# Patient Record
Sex: Male | Born: 1975 | Race: Black or African American | Hispanic: No | Marital: Single | Smoking: Never smoker
Health system: Southern US, Community
[De-identification: ages and names within clinical notes are randomized; demographics above are authoritative.]

## PROBLEM LIST (undated history)

## (undated) DIAGNOSIS — A599 Trichomoniasis, unspecified: Secondary | ICD-10-CM

## (undated) HISTORY — PX: WRIST SURGERY: SHX841

---

## 1998-09-25 ENCOUNTER — Inpatient Hospital Stay (HOSPITAL_COMMUNITY): Admission: EM | Admit: 1998-09-25 | Discharge: 1998-09-25 | Payer: Self-pay | Admitting: Emergency Medicine

## 1998-09-25 ENCOUNTER — Encounter: Payer: Self-pay | Admitting: Emergency Medicine

## 2004-10-11 ENCOUNTER — Emergency Department (HOSPITAL_COMMUNITY): Admission: EM | Admit: 2004-10-11 | Discharge: 2004-10-11 | Payer: Self-pay | Admitting: Emergency Medicine

## 2004-10-18 ENCOUNTER — Ambulatory Visit (HOSPITAL_COMMUNITY): Admission: RE | Admit: 2004-10-18 | Discharge: 2004-10-18 | Payer: Self-pay | Admitting: Orthopaedic Surgery

## 2005-03-23 ENCOUNTER — Emergency Department (HOSPITAL_COMMUNITY): Admission: EM | Admit: 2005-03-23 | Discharge: 2005-03-23 | Payer: Self-pay | Admitting: Emergency Medicine

## 2006-01-02 ENCOUNTER — Emergency Department (HOSPITAL_COMMUNITY): Admission: EM | Admit: 2006-01-02 | Discharge: 2006-01-02 | Payer: Self-pay | Admitting: *Deleted

## 2006-01-30 ENCOUNTER — Emergency Department (HOSPITAL_COMMUNITY): Admission: EM | Admit: 2006-01-30 | Discharge: 2006-01-30 | Payer: Self-pay | Admitting: Emergency Medicine

## 2006-03-11 ENCOUNTER — Observation Stay (HOSPITAL_COMMUNITY): Admission: EM | Admit: 2006-03-11 | Discharge: 2006-03-12 | Payer: Self-pay | Admitting: Emergency Medicine

## 2007-10-20 ENCOUNTER — Emergency Department (HOSPITAL_COMMUNITY): Admission: EM | Admit: 2007-10-20 | Discharge: 2007-10-20 | Payer: Self-pay | Admitting: Family Medicine

## 2008-08-14 ENCOUNTER — Emergency Department (HOSPITAL_COMMUNITY): Admission: EM | Admit: 2008-08-14 | Discharge: 2008-08-14 | Payer: Self-pay | Admitting: *Deleted

## 2008-08-14 ENCOUNTER — Emergency Department (HOSPITAL_COMMUNITY): Admission: EM | Admit: 2008-08-14 | Discharge: 2008-08-14 | Payer: Self-pay | Admitting: Emergency Medicine

## 2008-08-26 ENCOUNTER — Emergency Department (HOSPITAL_COMMUNITY): Admission: EM | Admit: 2008-08-26 | Discharge: 2008-08-26 | Payer: Self-pay | Admitting: Emergency Medicine

## 2010-06-12 LAB — GC/CHLAMYDIA PROBE AMP, GENITAL: GC Probe Amp, Genital: NEGATIVE

## 2010-07-21 NOTE — Op Note (Signed)
NAME:  [name], [name]                ACCOUNT NO.:  [id number]   MEDICAL RECORD NO.:  [id number]          PATIENT TYPE:  OBV   LOCATION:  2550                         FACILITY:  [facility]   PHYSICIAN:  Tennis Must Meyerdierks, M.D.DATE OF BIRTH:  [birth year]   DATE OF PROCEDURE:  03/11/2006  DATE OF DISCHARGE:                               OPERATIVE REPORT   PREOPERATIVE DIAGNOSIS:  Transscaphoid perilunate dislocation, right  wrist.   POSTOPERATIVE DIAGNOSIS:  Transscaphoid perilunate dislocation, right  wrist.   PROCEDURE:  Open reduction internal fixation, right scaphoid with distal  radius bone grafting and repair of ligaments, right wrist.   SURGEON:  Lowell Bouton, M.D.   ANESTHESIA:  General.   OPERATIVE FINDINGS:  The patient had a fracture through the scaphoid at  the very proximal pole.  The scapholunate ligaments were intact and  after stabilizing the scaphoid, the lunate was reduced and stable.   PROCEDURE:  Under general anesthesia with a tourniquet on the right arm,  the right hand was prepped and draped in usual fashion and after  exsanguinating the limb, the tourniquet was inflated to 250 mmHg.  A  dorsal approach was made to the wrist longitudinally at the fourth  dorsal compartment.  It was carried down through the subcutaneous  tissues and bleeding points were coagulated.  The rent in the capsule  was easily identified and significant blood was released.  The capsule  was opened distal to the radiocarpal joint.  There was a defect where  the lunate was volarly dislocated.  With longitudinal traction and a  Therapist, nutritional, the lunate was reduced.  The scaphoid fragment was quite  small and an attempt was made to put a Accutrac screw using the mini  Accutrac.  This broke through the fragment as it was too small for the  end of the Accutrac screw.  At this point K-wires were used to stabilize  the fracture fragment and the K-wires were placed from proximal  to  distal.  They were brought out distally out percutaneously in the thenar  eminence.  They were brought right under the articular surface of the  fracture fragment.  This allowed the lunate to be stabilized by  stabilizing the scapholunate ligament.  There was a gap in the scaphoid  and it was felt that distal radius bone graft would be of benefit, so a  oval-shaped defect with a window was made in the dorsal cortex of the  distal radius.  This was done with a 45 K-wire and an osteotome was used  to remove the dorsal cortex.  Cancellous bone was removed with a curette  then placed in the scaphoid.  This should give a better chance to heal  as it is a proximal pole fracture and has a high incidence of AVN.  The  wound was then irrigated copiously.  The ligaments were repaired with a  4-0 Mersilene suture.  Capsule was closed with 4-0 Mersilene.  Subcutaneous tissue was closed over a vessel loop drain with 4-0 Vicryl.  Skin was closed with a 3-0  subcuticular Prolene.  Sterile dressings were applied followed by a  volar and dorsal wrist splints.  The patient had the tourniquet released  with good circulation of the hand.  He went to the recovery room awake  and stable in condition.      Lowell Bouton, M.D.  Electronically Signed     EMM/MEDQ  D:  03/11/2006  T:  03/11/2006  Job:  [id number]

## 2010-07-21 NOTE — Consult Note (Signed)
NAMERANDEE, UPCHURCH                ACCOUNT NO.:  [id number]   MEDICAL RECORD NO.:  [id number]          PATIENT TYPE:  EMS   LOCATION:  ED                           FACILITY:  [facility]   PHYSICIAN:  Vanita Panda. Magnus Ivan, M.D.DATE OF BIRTH:  Mar 14, 1975   DATE OF CONSULTATION:  10/11/2004  DATE OF DISCHARGE:                                   CONSULTATION   REASON FOR CONSULTATION:  Left wrist laceration with questionable nerve and  tendon injury.   HISTORY OF PRESENT ILLNESS:  Briefly, Winner is a 35 year old right-hand-  dominant male who fell off a porch coming out of a night club at  approximately 3 a.m. this morning. He later came to the emergency room for  further evaluation and treatment. Orthopedic/hand was consulted and I saw  the patient promptly secondary to a questionable nerve and tendon injury.  The patient reports a decreased sensation in his left index finger only and  difficulty with flexing the index finger. He denies any other injuries and  denies numbness and tingling in other aspects of his hand.   PAST MEDICAL HISTORY:  Negative.   ALLERGIES:  No known drug allergies.   MEDICATIONS:  Negative.   SOCIAL HISTORY:  He does report smoking about a pack of cigarettes a day. He  does not work at the moment. He does not use drugs but does drink alcohol  socially. He is, again, right-hand dominant.   REVIEW OF SYSTEMS:  Negative for chest pain, shortness of breath, fever,  chills, nausea, vomiting, GI, GU, or other complaints.   PHYSICAL EXAMINATION:  VITAL SIGNS:  He is afebrile with stable vital signs.  GENERAL:  This is an alert and oriented male in no acute distress. He  follows commands appropriately.  EXTREMITIES:  Examination of the left upper extremity shows about a 5 cm  laceration on the volar aspect of the wrist just ulnar to the midline. It is  at the wrist crease. There is particulate debris in the wound itself. As far  as physical exam goes, he has  entirely intact intrinsic function of his  fingers and his thumb shows full EPO/FPO. The middle, ring, and little  fingers show intact FDP, FDS, and EDC function. The index finger shows  intact FDS function as well as extensor function, but he has no FDP function  to the DIP joint. He also has subjective decreased sensation along the index  finger but normal sensation in the long finger on both aspects, the ring  finger, the little finger, and the thumb. He has normal wrist motion, normal  pronation-supination at the wrist, as well as ulnar and radial deviation at  the wrist.   X-rays show no bony injury.   IMPRESSION:  This is a 35 year old with a left wrist laceration with a  questionable partial median nerve injury and partial tendon injury.   PLAN:  The wound will be explored in the emergency room with cleaning of the  superficial tissues. Due to the nature of this wound this does warrant  surgical exploration with examination with the microscope, cleaning  of the  particulate matter, as well as possible nerve and tendon repair. After the  wound was cleaned and repaired by me, I placed him in dorsal blocking splint  with the wrist in a slight flexed position. He follow-up will be in 2 days  so I can repeat his examination, given the interesting nature of his exam  with only the FDP and slight decreased sensation of his index finger only.  After that exam we will likely set him up for surgery in the next 3-5 days  for nerve exploration repair, as well as likely tendon repairs. He  understands the risks and benefits of this. I am going to send him out on  oral antibiotics as well as pain medication and, again, have him follow up  this coming Friday in 2 days.     _    CYB/MEDQ  D:  10/11/2004  T:  10/11/2004  Job:  [id number]

## 2010-07-21 NOTE — Op Note (Signed)
[name], [name]                ACCOUNT NO.:  [id number]   MEDICAL RECORD NO.:  [id number]          PATIENT TYPE:  AMB   LOCATION:  SDS                          FACILITY:  [facility]   PHYSICIAN:  [name]. [name], M.D.DATE OF BIRTH:  [birth year]   DATE OF PROCEDURE:  10/18/2004  DATE OF DISCHARGE:  10/18/2004                                 OPERATIVE REPORT   PREOPERATIVE DIAGNOSIS:  Left wrist laceration with questionable tendinous  injury to left index finger and questionable median nerve injury.   POSTOPERATIVE DIAGNOSIS:  Left wrist flexor digitorum superficialis (FDS)  lacerations to the index, middle and ring fingers.   PROCEDURE:  1.  Left wrist wound exploration.  2.  Direct primary repair of left wrist FDS tendons to the index, long and      ring fingers.   SURGEON:  [name]. [name], M.D.   ANESTHESIA:  General.   COMPLICATIONS:  None.   ANTIBIOTICS:  Kefzol 1 g IV.   INDICATIONS:  Briefly, [name] is a 35 year old gentleman who injured his  nondominant left wrist when he was coming outside of a nightclub less than a  week ago.  He fell off of a porch and sustained an injury with some type of  sharp object to his left wrist.  He had a longitudinal incision on the right  just ulnar to the palmaris longus tendon.  The injury was a longitudinal  laceration measuring only approximately 3 cm in length.  He underwent  irrigation, debridement in the emergency room and closure of the wound.  His  exam at the time showed some numbness along the index finger as well as an  inability to flex the DIP joint of the next finger.  He also had weak  flexion of the index, long and ring fingers at the PIP joints, but their  motion was intact.  This warranted exploration of the wound in an operative  environment.  He now returns to the operating room for wound exploration  with possible nerve and tendon repair.  The risks and benefits of this have  been explained to him  and well understood.   PROCEDURE DESCRIPTION:  After informed consent was obtained and the left arm  was marked, [name] was brought to the operating room and placed supine on  the operating table.  General anesthesia was obtained and then his left arm  was prepped from the fingers through the forearm and the elbow with Betadine  scrub and paint and then sterile tourniquet was placed around his upper left  arm.  An Esmarch was used to wrap up the arm and the tourniquet was inflated  to 250 mm of pressure.  The wound was opened and extended proximally and  distally to allow for exploration of the wound.  There was noted right away  to be lacerations of FDS to the index, long and ring fingers.  Surprisingly,  the FDP tendon was found to be intact and the median nerve was found to be  completely intact as well.  After cleaning the wound, he underwent direct  primary repair of FDS to the index, long and ring fingers sequentially using  3-0 Ethibond suture.  These were all repaired with the modified Kessler  technique to allow for core sutures across each suture strand.  I then used  a 6-0 epitendinous Prolene suture to run the epitendinous portion of the  suture.  I then put the fingers through a range of motion and found that the  repair was completely intact and there was no other injuries noted.  I then  cleaned the wound again and closed the superficial tissues with interrupted  2-0 Vicryl and the skin with interrupted 3-0 Prolene suture.  A well padded  sterile dressing was applied and I placed his wrist in a slightly palmar  flexed position with a plaster splint.  The tourniquet was then let down at  55 minutes of total tourniquet time and the fingers did pinken nicely.  The  patient was awakened, extubated and taken to the recovery room in stable  condition.  He will return in 2 days to the clinic for initiating a  splinting protocol for flexor tendon repair.            ______________________________  [name]. [name], M.D.     CYB/MEDQ  D:  10/30/2004  T:  10/31/2004  Job:  [id number]

## 2010-07-21 NOTE — Op Note (Signed)
NAMEFREMON, [name]                ACCOUNT NO.:  [id number]   MEDICAL RECORD NO.:  [id number]          PATIENT TYPE:  AMB   LOCATION:  SDS                          FACILITY:  [facility]   PHYSICIAN:  Vanita Panda. Magnus Ivan, M.D.DATE OF BIRTH:  December 27, 1975   DATE OF PROCEDURE:  10/18/2004  DATE OF DISCHARGE:  10/18/2004                                 OPERATIVE REPORT   PREOPERATIVE DIAGNOSIS:  Left wrist laceration with flexor tendon injury and  questionable nerve injury.   POSTOPERATIVE DIAGNOSIS:  Left wrist laceration with flexor digitorum  superficialis (FDS) tendons to left index and middle fingers.   PROCEDURE:  1.  Left wrist wound exploration.  2.  Direct primary repair of left wrist flexor digitorum superficialis      tendons to the index and middle fingers.   SURGEON:  Vanita Panda. Magnus Ivan, M.D.   ANESTHESIA:  General.   COMPLICATIONS:  None.   BLOOD LOSS:  Minimal.   TOURNIQUET TIME:  Fifty-five minutes.   ANTIBIOTICS:  One gram IV Kefzol.   INDICATIONS:  Briefly, Mr. Aeschliman is a 35 year old gentleman who a week ago  sustained a laceration to his left nondominant wrist.  This was the volar  aspect of the wrist and was a longitudinal wound.  It was just lateral to  the palmaris longus tendon.  This was at the level of the wrist.  He was  seen in the emergency department and found to have approximately a 5-cm  wound.  His deficits included weakness in the middle and index fingers as  well as numbness in the index finger.  His main weakness was the inability  to flex the index finger mainly at the DIP joint.  The wound was cleaned in  the ER and closed and then he was supposed to follow up in the clinic and  then subsequently scheduled for surgery.  In the holding room prior to  surgery, his nerve function had resolved and he denied any numbness in his  hand or fingers whatsoever.  He still had weakness with flexion of his index  and middle fingers and again,  difficulty with flexing at the DIP joint of  the fingers.  I felt this represented at least an injury to FDP; however, I  was concerned about the FDS, since it certainly rides more superficial.  Decision was made to certainly proceed with surgery, given the nature of his  injuries, and the risks and benefits of this were explained to him and he  agreed to proceed with surgery.   PROCEDURE DESCRIPTION:  After informed consent was obtained and the left  upper extremity was marked, Mr. Lall was brought to the operating room and  placed supine on the operating table.  General anesthesia was then obtained.  A non-sterile tourniquet was placed around his upper arm and his arm and the  hand and wrist were prepped with Betadine scrub and paint.  An Esmarch was  used to wrap off the arm and the tourniquet was inflated to 250 mmHg of  pressure.  The sutures were removed from his wrist  from the previous  longitudinal wound and then the wound was extended a few centimeters  proximally and distally.  Right away, there was noted to be injury to the  flexor digitorum superficialis (FDS) tendon; this was at the  musculotendinous junction and did seem to involve mainly the flexor tendons  to the index and middle fingers.  The ulnar artery was found to be intact  and further exploration of the wrist wound found the median nerve to be  intact as well as the deep flexor tendons.  The frayed edges of tendon were  debrided sharply and then the FDS tendons stenosed to the index and third  finger were reapproximated using 2-0 FiberWire suture using a modified  Kessler technique for __________ .  This was then over-sewn with an  epitendinous 5-0 Prolene suture.  Once the repair was completed, the fingers  were put through a range of motion and this demonstrated intact flexion of  fingers passively.  The wound was then copiously irrigated and once again,  exploration of the wound revealed no other injuries.  The  tourniquet was  then let down at 55 minutes and hemostasis was obtained.  The wound margins  were then reapproximated with interrupted 2-0 Vicryl suture in the  subcutaneous tissue followed by interrupted 3-0 Prolene in a simple  interrupted format.  Adaptic was applied to the wounds followed by a well-  padded dorsal blocking splint with the wrist in a palmar-flexed position.  The fingers did pink up nicely as the tourniquet was let down and the  patient was then awakened, extubated and taken to the recovery room in  stable condition.   He will follow up in the office in 2 days, where we will begin a splinting  protocol for nerve/tendon recovery status post repair.           ______________________________  Vanita Panda. Magnus Ivan, M.D.     CYB/MEDQ  D:  10/18/2004  T:  10/19/2004  Job:  [id number]

## 2011-05-07 ENCOUNTER — Encounter (HOSPITAL_COMMUNITY): Payer: Self-pay | Admitting: Emergency Medicine

## 2011-05-07 ENCOUNTER — Emergency Department (INDEPENDENT_AMBULATORY_CARE_PROVIDER_SITE_OTHER)
Admission: EM | Admit: 2011-05-07 | Discharge: 2011-05-07 | Disposition: A | Discharge status: Home Health | Payer: Self-pay | Source: Home / Self Care

## 2011-05-07 DIAGNOSIS — Z113 Encounter for screening for infections with a predominantly sexual mode of transmission: Secondary | ICD-10-CM

## 2011-05-07 HISTORY — DX: Trichomoniasis, unspecified: A59.9

## 2011-05-07 NOTE — ED Notes (Signed)
PT HERE FOR STD CHECK BUT DENIES PENILE D/C OR EXPOSURE.PT STATES ITS FOR SAFETY

## 2011-05-07 NOTE — ED Provider Notes (Signed)
History     CSN: [id number]  Arrival date & time 05/07/11  1701   None     Chief Complaint  Patient presents with  . Exposure to STD    (Consider location/radiation/quality/duration/timing/severity/associated sxs/prior treatment) HPI Comments: [name] presents this evening requesting routine exam and screening for STDs. He denies penile discharge, dysuria or genital lesions. He has no known exposures but inconsistently uses condoms with sexual activity. He states in the past he had routine screening done through the health department. He does not have a primary care provider.   Past Medical History  Diagnosis Date  . Trichomonas     4 YRS AGO    Past Surgical History  Procedure Date  . Wrist surgery     History reviewed. No pertinent family history.  History  Substance Use Topics  . Smoking status: Never Smoker   . Smokeless tobacco: Not on file  . Alcohol Use: No      Review of Systems  Constitutional: Negative for fever and chills.  Genitourinary: Negative for dysuria, discharge, genital sores, penile pain and testicular pain.    Allergies  Review of patient's allergies indicates not on file.  Home Medications  No current outpatient prescriptions on file.  BP 139/90  Pulse 70  Temp(Src) 97.6 F (36.4 C) (Oral)  Resp 16  SpO2 99%  Physical Exam  Nursing note and vitals reviewed. Constitutional: He appears well-developed and well-nourished. No distress.  HENT:  Head: Normocephalic and atraumatic.  Cardiovascular: Normal rate, regular rhythm and normal heart sounds.   Pulmonary/Chest: Effort normal and breath sounds normal. No respiratory distress.  Genitourinary: Testes normal and penis normal. Circumcised.  Lymphadenopathy:       Right: No inguinal adenopathy present.       Left: No inguinal adenopathy present.  Skin: Skin is warm and dry.  Psychiatric: He has a normal mood and affect.    ED Course  Procedures (including critical care  time)   Labs Reviewed  GC/CHLAMYDIA PROBE AMP, GENITAL   No results found.   1. Screen for STD (sexually transmitted disease)       MDM  Pt asymptomatic, presents this evening for routine screening. GC/Chlamydia pending - await results for treatment.         Melody Comas, [place] 05/07/11 [id number]

## 2011-05-07 NOTE — Discharge Instructions (Signed)
You will be notified if your vaginal cultures are abnormal. Always use condoms. In the future, you can also go to the Health Dept for STD screening as needed.

## 2011-05-08 LAB — GC/CHLAMYDIA PROBE AMP, GENITAL: GC Probe Amp, Genital: NEGATIVE

## 2011-05-08 NOTE — ED Provider Notes (Signed)
Medical screening examination/treatment/procedure(s) were performed by resident physician or non-physician practitioner and as supervising physician I was immediately available for consultation/collaboration.   Namon Villarin DOUGLAS MD.    Nautika Cressey Douglas Larz Mark, MD 05/08/11 1504 

## 2011-05-10 ENCOUNTER — Telehealth (HOSPITAL_COMMUNITY): Payer: Self-pay | Admitting: *Deleted

## 2011-05-10 NOTE — ED Notes (Signed)
Pt. called in for his lab results. Verified x 2 and given neg. GC/Chlamydia result. Pt. told he can get HIV testing at the [facility]. Vassie Moselle 05/10/2011

## 2013-09-02 ENCOUNTER — Emergency Department (HOSPITAL_COMMUNITY)
Admission: EM | Admit: 2013-09-02 | Discharge: 2013-09-02 | Discharge status: Absent without leave | Payer: BC Managed Care – PPO | Source: Home / Self Care

## 2013-10-21 ENCOUNTER — Emergency Department (INDEPENDENT_AMBULATORY_CARE_PROVIDER_SITE_OTHER)
Admission: EM | Admit: 2013-10-21 | Discharge: 2013-10-21 | Disposition: A | Discharge status: Home / Self Care | Payer: BC Managed Care – PPO | Source: Home / Self Care | Attending: Emergency Medicine | Admitting: Emergency Medicine

## 2013-10-21 ENCOUNTER — Other Ambulatory Visit (HOSPITAL_COMMUNITY)
Admission: RE | Admit: 2013-10-21 | Discharge: 2013-10-21 | Disposition: A | Discharge status: Home / Self Care | Payer: BC Managed Care – PPO | Source: Ambulatory Visit | Attending: Emergency Medicine | Admitting: Emergency Medicine

## 2013-10-21 ENCOUNTER — Encounter (HOSPITAL_COMMUNITY): Payer: Self-pay | Admitting: Emergency Medicine

## 2013-10-21 DIAGNOSIS — N342 Other urethritis: Secondary | ICD-10-CM

## 2013-10-21 DIAGNOSIS — Z113 Encounter for screening for infections with a predominantly sexual mode of transmission: Secondary | ICD-10-CM | POA: Insufficient documentation

## 2013-10-21 LAB — POCT URINALYSIS DIP (DEVICE)
BILIRUBIN URINE: NEGATIVE
GLUCOSE, UA: NEGATIVE mg/dL
HGB URINE DIPSTICK: NEGATIVE
KETONES UR: NEGATIVE mg/dL
NITRITE: NEGATIVE
Protein, ur: NEGATIVE mg/dL
Specific Gravity, Urine: >=1.03 (ref 1.005–1.030)
Urobilinogen, UA: 2 mg/dL — ABNORMAL HIGH (ref 0.0–1.0)
pH: 6.5 (ref 5.0–8.0)

## 2013-10-21 LAB — RPR

## 2013-10-21 LAB — HIV ANTIBODY (ROUTINE TESTING W REFLEX): HIV: NONREACTIVE

## 2013-10-21 MED ORDER — CEFTRIAXONE SODIUM 250 MG IJ SOLR
250.0000 mg | Freq: Once | INTRAMUSCULAR | Status: AC
  Administered 2013-10-21: 250 mg via INTRAMUSCULAR

## 2013-10-21 MED ORDER — AZITHROMYCIN 250 MG PO TABS
ORAL_TABLET | ORAL | Status: AC
  Filled 2013-10-21: qty 4

## 2013-10-21 MED ORDER — AZITHROMYCIN 250 MG PO TABS
1000.0000 mg | ORAL_TABLET | Freq: Once | ORAL | Status: AC
  Administered 2013-10-21: 1000 mg via ORAL

## 2013-10-21 MED ORDER — CEFTRIAXONE SODIUM 250 MG IJ SOLR
INTRAMUSCULAR | Status: AC
  Filled 2013-10-21: qty 250

## 2013-10-21 MED ORDER — LIDOCAINE HCL (PF) 1 % IJ SOLN
INTRAMUSCULAR | Status: AC
  Filled 2013-10-21: qty 5

## 2013-10-21 NOTE — Discharge Instructions (Signed)
Urethritis °Urethritis is an inflammation of the tube through which urine exits your bladder (urethra).  °CAUSES °Urethritis is often caused by an infection in your urethra. The infection can be viral, like herpes. The infection can also be bacterial, like gonorrhea. °RISK FACTORS °Risk factors of urethritis include: °· Having sex without using a condom. °· Having multiple sexual partners. °· Having poor hygiene. °SIGNS AND SYMPTOMS °Symptoms of urethritis are less noticeable in women than in men. These symptoms include: °· Burning feeling when you urinate (dysuria). °· Discharge from your urethra. °· Blood in your urine (hematuria). °· Urinating more than usual. °DIAGNOSIS  °To confirm a diagnosis of urethritis, your health care provider will do the following: °· Ask about your sexual history. °· Perform a physical exam. °· Have you provide a sample of your urine for lab testing. °· Use a cotton swab to gently collect a sample from your urethra for lab testing. °TREATMENT  °It is important to treat urethritis. Depending on the cause, untreated urethritis may lead to serious genital infections and possibly infertility. Urethritis caused by a bacterial infection is treated with antibiotic medicine. All sexual partners must be treated.  °HOME CARE INSTRUCTIONS °· Do not have sex until the test results are known and treatment is completed, even if your symptoms go away before you finish treatment. °· If you were prescribed an antibiotic, finish it all even if you start to feel better. °SEEK MEDICAL CARE IF:  °· Your symptoms are not improved in 3 days. °· Your symptoms are getting worse. °· You develop abdominal pain or pelvic pain (in women). °· You develop joint pain. °· You have a fever. °SEEK IMMEDIATE MEDICAL CARE IF:  °· You have severe pain in the belly, back, or side. °· You have repeated vomiting. °MAKE SURE YOU: °· Understand these instructions. °· Will watch your condition. °· Will get help right away if you  are not doing well or get worse. °Document Released: 08/15/2000 Document Revised: 07/06/2013 Document Reviewed: 10/20/2012 °[employer]® Patient Information ©2015 [employer]. This information is not intended to replace advice given to you by your health care provider. Make sure you discuss any questions you have with your health care provider. ° °

## 2013-10-21 NOTE — ED Provider Notes (Signed)
  Chief Complaint   Chief Complaint  Patient presents with  . Exposure to STD    History of Present Illness   [name] is a 38 year old male who has had a one-day history of urethral burning, dysuria, any whitish, yellow urethral discharge. He denies fevers, chills, sore throat, abdominal pain, nausea, vomiting, penile lesions, inguinal adenopathy, or testicular tenderness or swelling. He has had a history of STDs in the past. He had HIV testing about 2 weeks ago. His partner does not have any symptoms. He has had 2 partners in the past 3 months. He normally uses condoms, but on one occasion the condom broke.  Review of Systems   Other than as noted above, the patient denies any of the following symptoms: Systemic:  No fevers chills, arthralgias, or adenopathy. GI:  No abdominal pain, nausea or vomiting. GU:  No dysuria, penile pain, discharge, itching, dysuria, genital lesions, testicular pain or swelling. Skin:  No rash or itching.  PMFSH   Past medical history, family history, social history, meds, and allergies were reviewed.   Physical Examination    Vital signs:  BP 126/75  Pulse 66  Temp(Src) 97.8 F (36.6 C) (Oral)  Resp 18  SpO2 98% Gen:  Alert, oriented, in no distress. Abdomen:  Soft and flat, non-distended, and non-tender.  No organomegaly or mass. Genital:  Normal external genitalia, no urethral discharge, no penile lesions. No inguinal lymphadenopathy. Testes are normal. Skin:  Warm and dry.  No rash.   Labs   Results for orders placed during the hospital encounter of 10/21/13  POCT URINALYSIS DIP (DEVICE)      Result Value Ref Range   Glucose, UA NEGATIVE  NEGATIVE mg/dL   Bilirubin Urine NEGATIVE  NEGATIVE   Ketones, ur NEGATIVE  NEGATIVE mg/dL   Specific Gravity, Urine >=1.030  1.005 - 1.030   Hgb urine dipstick NEGATIVE  NEGATIVE   pH 6.5  5.0 - 8.0   Protein, ur NEGATIVE  NEGATIVE mg/dL   Urobilinogen, UA 2.0 (*) 0.0 - 1.0 mg/dL   Nitrite  NEGATIVE  NEGATIVE   Leukocytes, UA SMALL (*) NEGATIVE    DNA probes for gonorrhea, Chlamydia, and Trichomonas were obtained as well as serologies for HIV and syphilis.  Course in [facility]   Given Rocephin 250 mg IM and azithromycin 1000 mg by mouth.  Assessment   The encounter diagnosis was Urethritis.  Plan    1.  Meds:  The following meds were prescribed:   New Prescriptions   No medications on file    2.  Patient Education/Counseling:  The patient was given appropriate handouts, self care instructions, and instructed in symptomatic relief.The patient was instructed to inform all sexual contacts, avoid intercourse completely for 2 weeks and then only with a condom.  The patient was told that we would call about all abnormal lab results, and that we would need to report certain kinds of infection to the health department.    3.  Follow up:  The patient was told to follow up here if no better in 3 to 4 days, or sooner if becoming worse in any way, and given some red flag symptoms such as fever, pain, or difficulty urinating which would prompt immediate return.       Reuben Likesavid C Kymia Simi, MD 10/21/13 [phone]

## 2013-10-21 NOTE — ED Notes (Signed)
C/o yellow penile d/c and dysuria onset yest; sexually active Denies f/v/n/d, abd pain Alert, no signs of acute distress.

## 2013-10-27 ENCOUNTER — Telehealth (HOSPITAL_COMMUNITY): Payer: Self-pay | Admitting: *Deleted

## 2013-10-27 NOTE — ED Notes (Signed)
GC pos., Chlamydia and Trich neg., HIV/RPR non-reactive.  I called pt. Pt. verified x 2 and given results.  Pt. told he was adequately treated with Rocephin and Zithromax.  Pt. instructed to notify his partner, no sex for 1 week and to practice safe sex. Pt. told he should get HIV rechecked in 6 mos. at the [facility] Dept. STD clinic, by appointment.  DHHS form completed and faxed to the [facility] Department. Vassie Moselle 10/27/2013

## 2015-01-07 ENCOUNTER — Other Ambulatory Visit (HOSPITAL_COMMUNITY)
Admission: RE | Admit: 2015-01-07 | Discharge: 2015-01-07 | Disposition: A | Discharge status: Home / Self Care | Payer: Self-pay | Source: Ambulatory Visit | Attending: Family Medicine | Admitting: Family Medicine

## 2015-01-07 ENCOUNTER — Emergency Department (INDEPENDENT_AMBULATORY_CARE_PROVIDER_SITE_OTHER)
Admission: EM | Admit: 2015-01-07 | Discharge: 2015-01-07 | Disposition: A | Discharge status: Home / Self Care | Payer: Self-pay | Source: Home / Self Care | Attending: Family Medicine | Admitting: Family Medicine

## 2015-01-07 ENCOUNTER — Encounter (HOSPITAL_COMMUNITY): Payer: Self-pay | Admitting: Emergency Medicine

## 2015-01-07 DIAGNOSIS — Z113 Encounter for screening for infections with a predominantly sexual mode of transmission: Secondary | ICD-10-CM | POA: Insufficient documentation

## 2015-01-07 DIAGNOSIS — R369 Urethral discharge, unspecified: Secondary | ICD-10-CM

## 2015-01-07 MED ORDER — CEFTRIAXONE SODIUM 250 MG IJ SOLR
INTRAMUSCULAR | Status: AC
  Filled 2015-01-07: qty 250

## 2015-01-07 MED ORDER — CEFTRIAXONE SODIUM 250 MG IJ SOLR
250.0000 mg | Freq: Once | INTRAMUSCULAR | Status: AC
  Administered 2015-01-07: 250 mg via INTRAMUSCULAR

## 2015-01-07 MED ORDER — AZITHROMYCIN 250 MG PO TABS
1000.0000 mg | ORAL_TABLET | Freq: Once | ORAL | Status: AC
  Administered 2015-01-07: 1000 mg via ORAL

## 2015-01-07 MED ORDER — LIDOCAINE HCL (PF) 1 % IJ SOLN
INTRAMUSCULAR | Status: AC
  Filled 2015-01-07: qty 5

## 2015-01-07 MED ORDER — AZITHROMYCIN 250 MG PO TABS
ORAL_TABLET | ORAL | Status: AC
  Filled 2015-01-07: qty 4

## 2015-01-07 NOTE — ED Notes (Signed)
C/o yellowish penile d/c onset today Denies dysuria, fevers, chills, abd pain A&O x4... No acute distress.

## 2015-01-07 NOTE — ED Provider Notes (Signed)
CSN: [id number]     Arrival date & time 01/07/15  1303 History   First MD Initiated Contact with Patient 01/07/15 1326     Chief Complaint  Patient presents with  . Exposure to STD   (Consider location/radiation/quality/duration/timing/severity/associated sxs/prior Treatment) Patient is a 39 y.o. male presenting with STD exposure. The history is provided by the patient.  Exposure to STD This is a new problem. The current episode started 12 to 24 hours ago. The problem occurs constantly. The problem has not changed since onset.Nothing aggravates the symptoms. Nothing relieves the symptoms. He has tried nothing for the symptoms.    Past Medical History  Diagnosis Date  . Trichomonas     4 YRS AGO   Past Surgical History  Procedure Laterality Date  . Wrist surgery     No family history on file. Social History  Substance Use Topics  . Smoking status: Never Smoker   . Smokeless tobacco: None  . Alcohol Use: No    Review of Systems  Constitutional: Negative.   HENT: Negative.   Eyes: Negative.   Respiratory: Negative.   Cardiovascular: Negative.   Gastrointestinal: Negative.   Endocrine: Negative.   Genitourinary: Positive for dysuria, discharge and penile pain.  Musculoskeletal: Negative.   Skin: Negative.   Allergic/Immunologic: Negative.   Neurological: Negative.   Hematological: Negative.   Psychiatric/Behavioral: Negative.     Allergies  Review of patient's allergies indicates no known allergies.  Home Medications   Prior to Admission medications   Not on File   Meds Ordered and Administered this Visit   Medications  azithromycin (ZITHROMAX) tablet 1,000 mg (not administered)  cefTRIAXone (ROCEPHIN) injection 250 mg (not administered)    BP 146/84 mmHg  Pulse 69  Temp(Src) 97.6 F (36.4 C) (Oral)  Resp 16  SpO2 100% No data found.   Physical Exam  Constitutional: He appears well-developed and well-nourished.  HENT:  Head: Normocephalic and  atraumatic.  Eyes: Conjunctivae and EOM are normal. Pupils are equal, round, and reactive to light.  Neck: Normal range of motion. Neck supple.  Cardiovascular: Normal rate, regular rhythm and normal heart sounds.   Pulmonary/Chest: Effort normal and breath sounds normal.  Abdominal: Soft. Bowel sounds are normal.  Genitourinary: Penile tenderness present.  Whitish yellow urethral discharge.    ED Course  Procedures (including critical care time)  Labs Review Labs Reviewed  URINE CULTURE  URINE CYTOLOGY ANCILLARY ONLY    Imaging Review No results found.   Visual Acuity Review  Right Eye Distance:   Left Eye Distance:   Bilateral Distance:    Right Eye Near:   Left Eye Near:    Bilateral Near:         MDM  Urethral DC  Zithromax 1000mg  po now Rocephin 250mg  IM now  Wear condom with sex  Urine for GC/chlamydia, trich Urine routine w cx if indicated  Deatra CanterWilliam J Ashanty Coltrane FNP     Deatra CanterWilliam J Audric Venn, FNP 01/07/15 [phone]

## 2015-01-07 NOTE — ED Notes (Signed)
Call back number verified.  

## 2015-01-07 NOTE — Discharge Instructions (Signed)
Chlamydia Test  WHY AM I HAVING THIS TEST?  This a test to see if you have chlamydia. Chlamydia is a common sexually transmitted disease (STD). Your health care provider may perform this test if you:  · Are sexually active.  · Have another STD.  · Have complaints about pelvic pain, vaginal discharge, or both.  WHAT KIND OF SAMPLE IS TAKEN?  Depending on your symptoms, your health care provider may collect any one of the following samples:  · A blood sample. This is usually collected by inserting a needle into a vein.  · A tissue sample. This is collected by swabbing tissue of the eye, urethra, or cervix.  · A sample of sputum. This is collected by having you cough into a sterile container that is provided by the lab.  HOW DO I PREPARE FOR THE TEST?  There is no preparation required for this test.  HOW ARE THE TEST RESULTS REPORTED?  Your test results will be reported as either positive or negative. It is your responsibility to obtain your test results. Ask the lab or department performing the test when and how you will get your results.  WHAT DO THE RESULTS MEAN?  A positive result means that you have a chlamydia infection.  Talk with your health care provider to discuss your results, treatment options, and if necessary, the need for more tests. Talk with your health care provider if you have any questions about your results.     This information is not intended to replace advice given to you by your health care provider. Make sure you discuss any questions you have with your health care provider.     Document Released: 03/14/2004 Document Revised: 03/12/2014 Document Reviewed: 07/15/2013  Elsevier Interactive Patient Education ©2016 [employer]

## 2015-01-07 NOTE — ED Notes (Signed)
Patient unable to void at this time

## 2015-01-08 LAB — URINE CULTURE: Culture: NO GROWTH

## 2015-01-10 LAB — URINE CYTOLOGY ANCILLARY ONLY
Chlamydia: NEGATIVE
Neisseria Gonorrhea: POSITIVE — AB
Trichomonas: NEGATIVE

## 2015-01-10 NOTE — ED Notes (Signed)
Final report of STD testing positive for GC, negative for chlamydia. Treatment day of UCC visit adequate. Form 2124 HHS [facility]. Called patient , and after verifying ID, discussed positive findings. Was advised to refrain from unprotected sex x 1 week until infections clear, advise his partner of their exposure so they can be treated, practice safer sex

## 2019-05-25 ENCOUNTER — Other Ambulatory Visit: Payer: Self-pay

## 2019-05-25 ENCOUNTER — Ambulatory Visit (HOSPITAL_COMMUNITY)
Admission: EM | Admit: 2019-05-25 | Discharge: 2019-05-25 | Disposition: A | Discharge status: Home / Self Care | Payer: 59 | Attending: Family Medicine | Admitting: Family Medicine

## 2019-05-25 ENCOUNTER — Encounter (HOSPITAL_COMMUNITY): Payer: Self-pay | Admitting: Emergency Medicine

## 2019-05-25 ENCOUNTER — Ambulatory Visit (INDEPENDENT_AMBULATORY_CARE_PROVIDER_SITE_OTHER): Payer: 59

## 2019-05-25 DIAGNOSIS — M25531 Pain in right wrist: Secondary | ICD-10-CM | POA: Diagnosis not present

## 2019-05-25 DIAGNOSIS — M1250 Traumatic arthropathy, unspecified site: Secondary | ICD-10-CM | POA: Diagnosis not present

## 2019-05-25 MED ORDER — IBUPROFEN 800 MG PO TABS: 800.0000 mg | ORAL_TABLET | Freq: Three times a day (TID) | ORAL | 0 refills | Status: DC

## 2019-05-25 NOTE — Discharge Instructions (Addendum)
Take the ibuprofen 3 x a day with food This is for pain Wear brace during heavy activity Follow up with Dr August Saucer in orthopedics

## 2019-05-25 NOTE — ED Provider Notes (Signed)
[facility]    CSN: [id number] Arrival date & time: 05/25/19  1430      History   Chief Complaint Chief Complaint  Patient presents with  . Wrist Pain    HPI [name] is a 44 y.o. male.   HPI  Patient states that he fractured his right wrist in 2010.  He had surgery to repair.  He is uncertain of his fracture.  He states there were pins on the outside of his wrist to secure the fractures. He states that he recovered from this and initially seemed well.  Ever since then, intermittently is having wrist pain.  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  He is having progressively increasing difficulty with his right wrist.  Increasing pain.  Increasing swelling.  Worse at the end of his work shift. No numbness or weakness. No new trauma. No other joints that are painful. Review of old imaging shows that in 2008 right wrist she had a metacarpal fracture dislocation. In 2010 he was found to have an old scaphoid fracture. Uncertain when the scaphoid fracture might have occurred.  Past Medical History:  Diagnosis Date  . Trichomonas    4 YRS AGO    There are no problems to display for this patient.   Past Surgical History:  Procedure Laterality Date  . WRIST SURGERY         Home Medications    Prior to Admission medications   Medication Sig Start Date End Date Taking? Authorizing Provider  ibuprofen (ADVIL) 800 MG tablet Take 1 tablet (800 mg total) by mouth 3 (three) times daily. 05/25/19   Eustace Moore, MD    Family History Family History  Problem Relation Age of Onset  . Healthy Mother   . Healthy Father     Social History Social History   Tobacco Use  . Smoking status: Never Smoker  . Smokeless tobacco: Never Used  Substance Use Topics  . Alcohol use: Yes  . Drug use: No     Allergies   Patient has no known allergies.   Review of Systems Review of Systems  Musculoskeletal: Positive for arthralgias.       Physical Exam Triage Vital Signs ED Triage Vitals  Enc Vitals Group     BP 05/25/19 1453 (!) 149/93     Pulse Rate 05/25/19 1453 69     Resp 05/25/19 1453 18     Temp 05/25/19 1453 97.8 F (36.6 C)     Temp Source 05/25/19 1453 Oral     SpO2 05/25/19 1453 97 %     Weight --      Height --      Head Circumference --      Peak Flow --      Pain Score 05/25/19 1454 6     Pain Loc --      Pain Edu? --      Excl. in [place]? --    No data found.  Updated Vital Signs BP (!) 149/93 (BP Location: Right Arm)   Pulse 69   Temp 97.8 F (36.6 C) (Oral)   Resp 18   SpO2 97%       Physical Exam Constitutional:      General: He is not in acute distress.    Appearance: Normal appearance. He is well-developed and normal weight.  HENT:     Head: Normocephalic and atraumatic.     Mouth/Throat:  Comments: Mask is in place Eyes:     Conjunctiva/sclera: Conjunctivae normal.     Pupils: Pupils are equal, round, and reactive to light.  Cardiovascular:     Rate and Rhythm: Normal rate.  Pulmonary:     Effort: Pulmonary effort is normal. No respiratory distress.  Musculoskeletal:        General: Normal range of motion.     Cervical back: Normal range of motion.     Comments: Right wrist has a scar midline over the carpal bones, volar surface.  Very limited range of motion to flexion/ extension and ulnar/radial deviation.  Grip strength is slightly less than left.  Distal neurovascular is intact.  There is some soft tissue swelling over the dorsum of the wrist with warmth.  Mild crepitus with range of motion.  No tenderness over scaphoid.  Skin:    General: Skin is warm and dry.  Neurological:     General: No focal deficit present.     Mental Status: He is alert.  Psychiatric:        Mood and Affect: Mood normal.        Behavior: Behavior normal.      UC Treatments / Results  Labs (all labs ordered are listed, but only abnormal results are displayed) Labs Reviewed - No  data to display  EKG   Radiology DG Wrist Complete Right  Result Date: 05/25/2019 CLINICAL DATA:  Acute right wrist pain without recent injury. EXAM: RIGHT WRIST - COMPLETE 3+ VIEW COMPARISON:  August 26, 2008. FINDINGS: No acute fracture or dislocation is noted. Old scaphoid fracture is noted with evidence of nonunion. Joint spaces are unremarkable. No other significant abnormality is noted. IMPRESSION: No acute abnormality seen in the right wrist. Old scaphoid fracture is again noted. Electronically Signed   By: Marijo Conception M.D.   On: 05/25/2019 15:29    Procedures Procedures (including critical care time)  Medications Ordered in UC Medications - No data to display  Initial Impression / Assessment and Plan / UC Course  I have reviewed the triage vital signs and the nursing notes.  Pertinent labs & imaging results that were available during my care of the patient were reviewed by me and considered in my medical decision making (see chart for details).     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  He has limited range of motion.  He needs to follow-up with an orthopedic surgeon, likely hand specialist.  He is given the number of Dr. Marlou Sa who is the doctor on-call.  He is also told that he can go to one of the orthopedic walk-in clinics. Final Clinical Impressions(s) / UC Diagnoses   Final diagnoses:  Right wrist pain  Traumatic arthritis     Discharge Instructions     Take the ibuprofen 3 x a day with food This is for pain Wear brace during heavy activity Follow up with Dr Marlou Sa in orthopedics    ED Prescriptions    Medication Sig Dispense Auth. Provider   ibuprofen (ADVIL) 800 MG tablet Take 1 tablet (800 mg total) by mouth 3 (three) times daily. 21 tablet Raylene Everts, MD     PDMP not reviewed this encounter.   Raylene Everts, MD 05/25/19 [phone]

## 2019-05-25 NOTE — ED Triage Notes (Signed)
Pt here with right wrist pain that is chronic in nature worse over last 2 weeks; pt sts hx of sx on wrist and sts uses his wrist for work; lifting heavy things

## 2019-06-05 ENCOUNTER — Other Ambulatory Visit: Payer: Self-pay

## 2019-06-05 ENCOUNTER — Ambulatory Visit (HOSPITAL_COMMUNITY)
Admission: EM | Admit: 2019-06-05 | Discharge: 2019-06-05 | Disposition: A | Discharge status: Home / Self Care | Payer: 59 | Attending: Family Medicine | Admitting: Family Medicine

## 2019-06-05 ENCOUNTER — Encounter (HOSPITAL_COMMUNITY): Payer: Self-pay

## 2019-06-05 DIAGNOSIS — M25632 Stiffness of left wrist, not elsewhere classified: Secondary | ICD-10-CM

## 2019-06-05 DIAGNOSIS — M25532 Pain in left wrist: Secondary | ICD-10-CM | POA: Diagnosis not present

## 2019-06-05 DIAGNOSIS — R202 Paresthesia of skin: Secondary | ICD-10-CM

## 2019-06-05 MED ORDER — IBUPROFEN 600 MG PO TABS: 600.0000 mg | ORAL_TABLET | Freq: Four times a day (QID) | ORAL | 0 refills | Status: DC | PRN

## 2019-06-05 MED ORDER — PREDNISONE 10 MG (21) PO TBPK: ORAL_TABLET | Freq: Every day | ORAL | 0 refills | Status: AC

## 2019-06-05 NOTE — Discharge Instructions (Signed)
Take the ibuprofen as prescribed.  Rest and elevate your hand.  Apply ice packs 2-3 times a day for up to 20 minutes each.  Wear the splint at night.  Follow up with your primary care provider or an orthopedist if you symptoms continue or worsen;  Or if you develop new symptoms, such as numbness, tingling, or weakness.

## 2019-06-05 NOTE — ED Provider Notes (Signed)
[facility]    CSN: [id number] Arrival date & time: 06/05/19  3016      History   Chief Complaint Chief Complaint  Patient presents with  . Wrist Pain    HPI [name] is a 44 y.o. male.   Patient presents today with left wrist pain that has progressively gotten worse over the last week.  Reports that the fingers on his left hand will be numb by the time he goes to bed.  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  Patient reports that he had surgery on the left wrist, and he had severed 2 tendons in 2006.  He reports that this occurs every few years.  Denies established relationship with orthopedist or continued relationship with pain specialist.  Denies any treatments at home for this.  Does not have a brace to splint his wrist.  ROS per HPI  The history is provided by the patient.    Past Medical History:  Diagnosis Date  . Trichomonas    4 YRS AGO    There are no problems to display for this patient.   Past Surgical History:  Procedure Laterality Date  . WRIST SURGERY         Home Medications    Prior to Admission medications   Medication Sig Start Date End Date Taking? Authorizing Provider  ibuprofen (ADVIL) 600 MG tablet Take 1 tablet (600 mg total) by mouth every 6 (six) hours as needed. 06/05/19   Faustino Congress, NP  predniSONE (STERAPRED UNI-PAK 21 TAB) 10 MG (21) TBPK tablet Take by mouth daily for 6 days. Take 6 tablets on day 1, 5 tablets on day 2, 4 tablets on day 3, 3 tablets on day 4, 2 tablets on day 5, 1 tablet on day 6 06/05/19 06/11/19  Faustino Congress, NP    Family History Family History  Problem Relation Age of Onset  . Healthy Mother   . Healthy Father     Social History Social History   Tobacco Use  . Smoking status: Never Smoker  . Smokeless tobacco: Never Used  Substance Use Topics  . Alcohol use: Yes    Comment: occ  . Drug use: No     Allergies    Patient has no known allergies.   Review of Systems Review of Systems   Physical Exam Triage Vital Signs ED Triage Vitals [06/05/19 0925]  Enc Vitals Group     BP (!) 145/85     Pulse Rate 64     Resp 16     Temp 97.8 F (36.6 C)     Temp Source Oral     SpO2 98 %     Weight 180 lb (81.6 kg)     Height 6' (1.829 m)     Head Circumference      Peak Flow      Pain Score 8     Pain Loc      Pain Edu?      Excl. in [place]?    No data found.  Updated Vital Signs BP (!) 145/85   Pulse 64   Temp 97.8 F (36.6 C) (Oral)   Resp 16   Ht 6' (1.829 m)   Wt 180 lb (81.6 kg)   SpO2 98%   BMI 24.41 kg/m   Visual Acuity Right Eye Distance:   Left Eye Distance:   Bilateral Distance:    Right Eye  Near:   Left Eye Near:    Bilateral Near:     Physical Exam Vitals and nursing note reviewed.  Constitutional:      General: He is not in acute distress.    Appearance: Normal appearance. He is well-developed and normal weight. He is not ill-appearing.  HENT:     Head: Normocephalic and atraumatic.     Mouth/Throat:     Mouth: Mucous membranes are moist.     Pharynx: Oropharynx is clear.  Eyes:     Extraocular Movements: Extraocular movements intact.     Conjunctiva/sclera: Conjunctivae normal.     Pupils: Pupils are equal, round, and reactive to light.  Cardiovascular:     Rate and Rhythm: Normal rate and regular rhythm.     Heart sounds: Normal heart sounds. No murmur.  Pulmonary:     Effort: Pulmonary effort is normal. No respiratory distress.     Breath sounds: Normal breath sounds. No stridor. No wheezing, rhonchi or rales.  Chest:     Chest wall: No tenderness.  Abdominal:     General: Abdomen is flat.     Palpations: Abdomen is soft.     Tenderness: There is no abdominal tenderness.  Musculoskeletal:        General: Tenderness and deformity present.     Cervical back: Normal range of motion and neck supple.     Comments: Left wrist decreased range of motion,  surgical scar present to the inner aspect of the wrist, vertical about 3 cm long.  Skin:    General: Skin is warm and dry.  Neurological:     General: No focal deficit present.     Mental Status: He is alert and oriented to person, place, and time.  Psychiatric:        Mood and Affect: Mood normal.        Behavior: Behavior normal.        Thought Content: Thought content normal.      UC Treatments / Results  Labs (all labs ordered are listed, but only abnormal results are displayed) Labs Reviewed - No data to display  EKG   Radiology No results found.  Procedures Procedures (including critical care time)  Medications Ordered in UC Medications - No data to display  Initial Impression / Assessment and Plan / UC Course  I have reviewed the triage vital signs and the nursing notes.  Pertinent labs & imaging results that were available during my care of the patient were reviewed by me and considered in my medical decision making (see chart for details).     Presents today with left wrist pain, left finger numbness, decreased range of motion of the left wrist.  Instructed patient to sleep at night with the splint, he may wear this while he is working as well.  Work note that he may return on Monday to full duty.  Instructed if he is not feeling any better by Monday, that he needs to follow-up with hand specialist.  Prednisone taper prescribed, instructed patient on how to take this medication to help with inflammation.  Ibuprofen 6 or milligrams every 6 hours as needed for pain also prescribed.  If symptoms are not improving or getting worse by Monday, follow-up with orthopedics or hand specialist. Final Clinical Impressions(s) / UC Diagnoses   Final diagnoses:  Left wrist pain  Left hand paresthesia  Decreased range of motion of left wrist     Discharge Instructions     Take the  ibuprofen as prescribed.  Rest and elevate your hand.  Apply ice packs 2-3 times a day for up  to 20 minutes each.  Wear the splint at night.  Follow up with your primary care provider or an orthopedist if you symptoms continue or worsen;  Or if you develop new symptoms, such as numbness, tingling, or weakness.       ED Prescriptions    Medication Sig Dispense Auth. Provider   predniSONE (STERAPRED UNI-PAK 21 TAB) 10 MG (21) TBPK tablet Take by mouth daily for 6 days. Take 6 tablets on day 1, 5 tablets on day 2, 4 tablets on day 3, 3 tablets on day 4, 2 tablets on day 5, 1 tablet on day 6 21 tablet Moshe Cipro, NP   ibuprofen (ADVIL) 600 MG tablet Take 1 tablet (600 mg total) by mouth every 6 (six) hours as needed. 30 tablet Moshe Cipro, NP     PDMP not reviewed this encounter.   Moshe Cipro, NP 06/05/19 1209
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
## 2021-06-06 IMAGING — DX DG WRIST COMPLETE 3+V*R*
4 series · 4 of 4 positions shown · non-contrast
Comparison: August 26, 2008.

CLINICAL DATA: Acute right wrist pain without recent injury.

EXAM:
RIGHT WRIST - COMPLETE 3+ VIEW

[wrist pa]
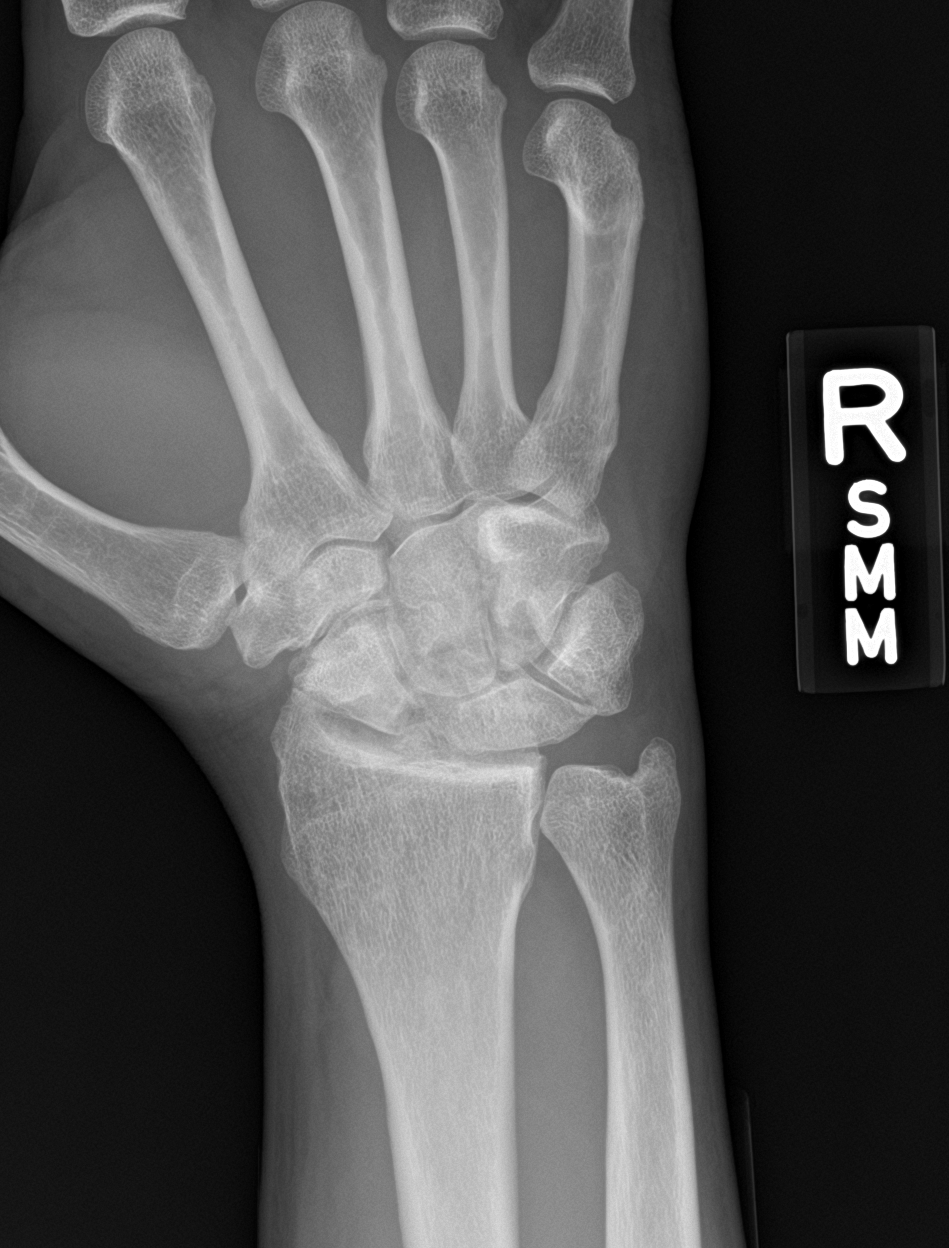

[wrist navicular]
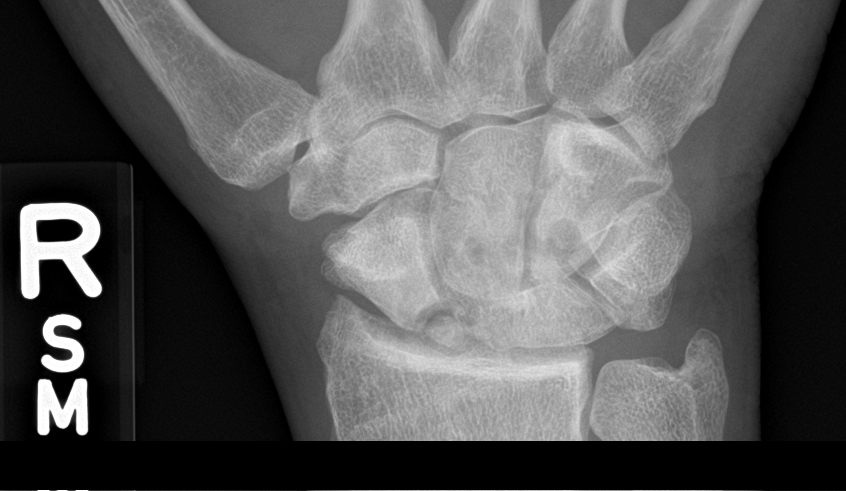

[wrist obl]
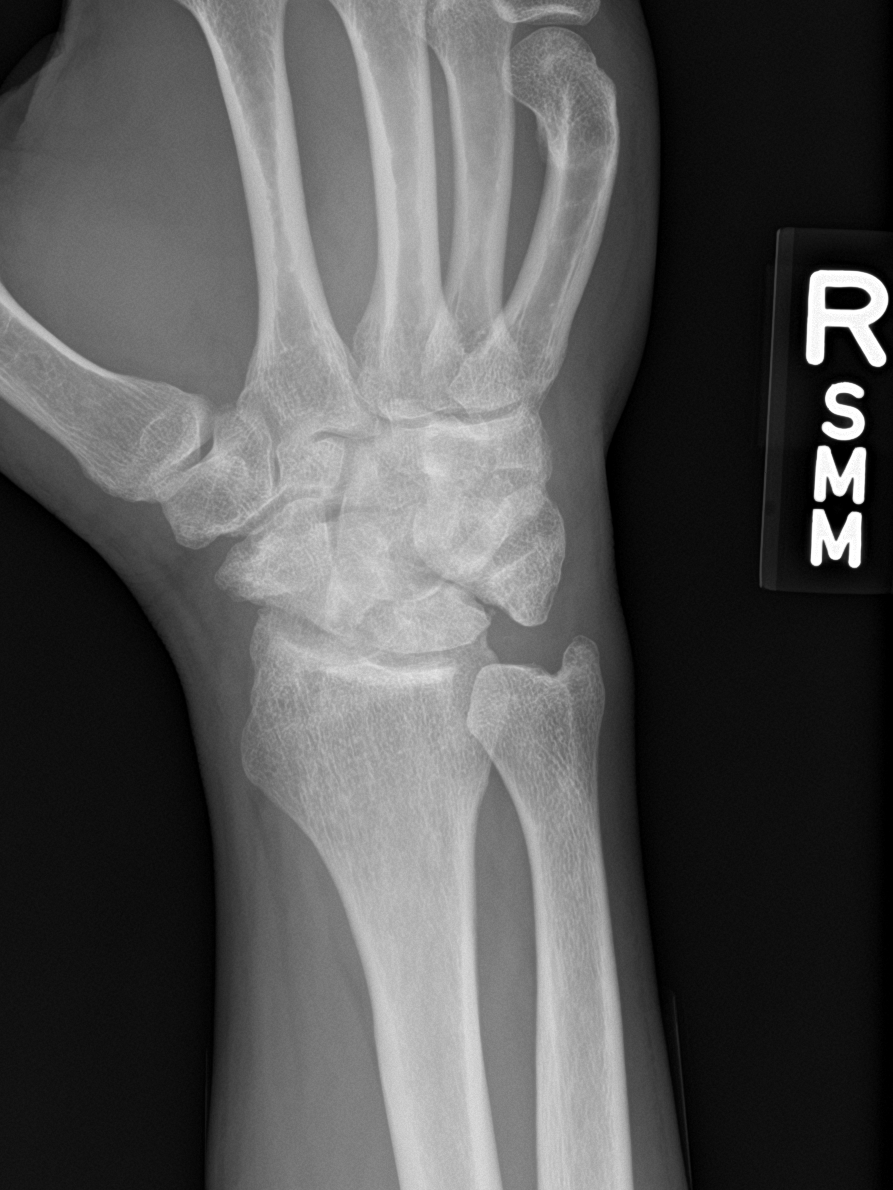

[wrist lat]
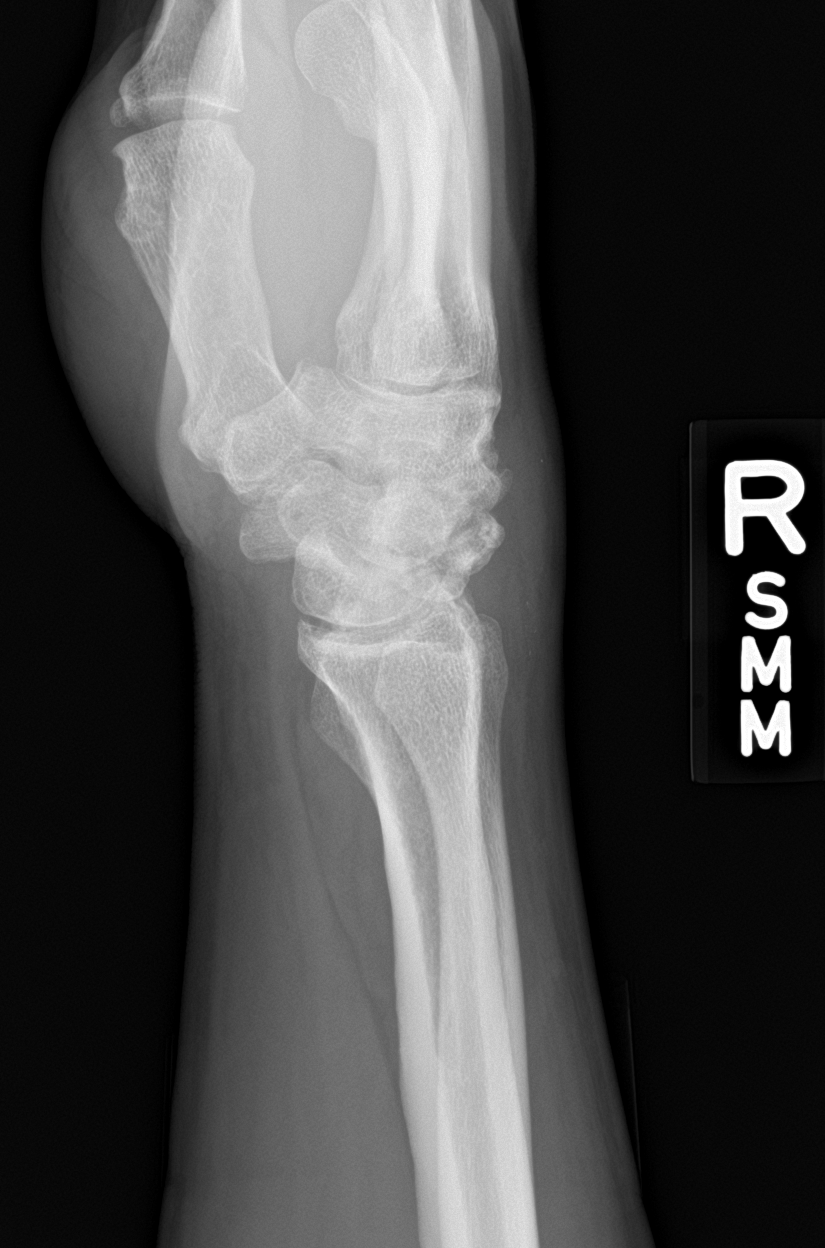

[4 of 4 positions shown; findings below may reference images not displayed]

FINDINGS: No acute fracture or dislocation is noted. Old scaphoid fracture is
noted with evidence of nonunion. Joint spaces are unremarkable. No
other significant abnormality is noted.
IMPRESSION: No acute abnormality seen in the right wrist. Old scaphoid fracture
is again noted.

## 2023-01-08 ENCOUNTER — Ambulatory Visit (HOSPITAL_COMMUNITY)
Admission: RE | Admit: 2023-01-08 | Discharge: 2023-01-08 | Disposition: A | Discharge status: Home / Self Care | Payer: BC Managed Care – PPO | Source: Ambulatory Visit | Attending: Internal Medicine | Admitting: Internal Medicine

## 2023-01-08 ENCOUNTER — Encounter (HOSPITAL_COMMUNITY): Payer: Self-pay

## 2023-01-08 VITALS — BP 135/85 | HR 60 | Temp 98.2°F | Resp 16

## 2023-01-08 DIAGNOSIS — K047 Periapical abscess without sinus: Secondary | ICD-10-CM | POA: Diagnosis not present

## 2023-01-08 MED ORDER — PENICILLIN V POTASSIUM 500 MG PO TABS: 500.0000 mg | ORAL_TABLET | Freq: Four times a day (QID) | ORAL | 0 refills | Status: AC

## 2023-01-08 NOTE — ED Provider Notes (Signed)
[facility]    CSN: [id number] Arrival date & time: 01/08/23  0807      History   Chief Complaint Chief Complaint  Patient presents with   Abscess    HPI [name] is a 47 y.o. male who presents with an hard knot/abscess on his L upper gum where right below he has a chipped tooth, which started 5 days ago. Had a lot of pain for 2 days. He has been rinsing with warmth salt water and since yesterday it has been draining. Has been taking Aleve for pain which has been helping. He denies any current pain right now. Does not have a dentist.     Past Medical History:  Diagnosis Date   Trichomonas    4 YRS AGO    There are no problems to display for this patient.   Past Surgical History:  Procedure Laterality Date   WRIST SURGERY         Home Medications    Prior to Admission medications   Medication Sig Start Date End Date Taking? Authorizing Provider  penicillin v potassium (VEETID) 500 MG tablet Take 1 tablet (500 mg total) by mouth 4 (four) times daily for 10 days. 01/08/23 01/18/23 Yes Rodriguez-Southworth, Nettie Elm, PA-C    Family History Family History  Problem Relation Age of Onset   Healthy Mother    Healthy Father     Social History Social History   Tobacco Use   Smoking status: Never   Smokeless tobacco: Never  Vaping Use   Vaping status: Never Used  Substance Use Topics   Alcohol use: Yes    Comment: occ   Drug use: No     Allergies   Patient has no known allergies.   Review of Systems Review of Systems As noted in HPI  Physical Exam Triage Vital Signs ED Triage Vitals  Encounter Vitals Group     BP 01/08/23 0829 135/85     Systolic BP Percentile --      Diastolic BP Percentile --      Pulse Rate 01/08/23 0829 60     Resp 01/08/23 0829 16     Temp 01/08/23 0829 98.2 F (36.8 C)     Temp Source 01/08/23 0829 Oral     SpO2 01/08/23 0829 98 %     Weight --      Height --      Head Circumference --      Peak Flow --       Pain Score 01/08/23 0834 0     Pain Loc --      Pain Education --      Exclude from Growth Chart --    No data found.  Updated Vital Signs BP 135/85 (BP Location: Right Arm)   Pulse 60   Temp 98.2 F (36.8 C) (Oral)   Resp 16   SpO2 98%   Visual Acuity Right Eye Distance:   Left Eye Distance:   Bilateral Distance:    Right Eye Near:   Left Eye Near:    Bilateral Near:     Physical Exam Vitals reviewed.  Constitutional:      General: He is not in acute distress.    Appearance: He is normal weight. He is not ill-appearing, toxic-appearing or diaphoretic.  HENT:     Right Ear: External ear normal.     Left Ear: External ear normal.     Nose: Nose normal.     Mouth/Throat:  Lips: Pink.     Mouth: Mucous membranes are moist.     Dentition: Dental abscesses present. No gingival swelling.      Comments: Chipped tooth. The gum right above it has mild erythema, but I dont see drainage present right now.  Musculoskeletal:     Cervical back: Neck supple.  Lymphadenopathy:     Cervical: No cervical adenopathy.  Neurological:     Mental Status: He is alert.      UC Treatments / Results  Labs (all labs ordered are listed, but only abnormal results are displayed) Labs Reviewed - No data to display  EKG   Radiology No results found.  Procedures Procedures (including critical care time)  Medications Ordered in UC Medications - No data to display  Initial Impression / Assessment and Plan / UC Course  I have reviewed the triage vital signs and the nursing notes.  Dental abscess  I p[laced him on Penicillin as noted. Needs to establish with a dentist, and he said he does have dental insurance, so I taught him how to find one online.     Final Clinical Impressions(s) / UC Diagnoses   Final diagnoses:  Dental abscess     Discharge Instructions      Continue rinsing with warm salt water for a couple of more days      ED Prescriptions      Medication Sig Dispense Auth. Provider   penicillin v potassium (VEETID) 500 MG tablet Take 1 tablet (500 mg total) by mouth 4 (four) times daily for 10 days. 40 tablet Rodriguez-Southworth, Nettie Elm, PA-C      PDMP not reviewed this encounter.   Garey Ham, [place] 01/08/23 [phone]

## 2023-01-08 NOTE — ED Triage Notes (Signed)
Pt presents with abscess to upper gum that he first noticed on Friday 11/1. Pt currently rates his pain a 0/10. Pt reports he took Aleve for pain on Saturday 11/2, effective for pain relief per description.

## 2023-01-08 NOTE — Discharge Instructions (Addendum)
Continue rinsing with warm salt water for a couple of more days
